# Patient Record
Sex: Male | Born: 1995 | Race: White | Hispanic: No | Marital: Married | Smoking: Never smoker
Health system: Southern US, Community
[De-identification: ages and names within clinical notes are randomized; demographics above are authoritative.]

## PROBLEM LIST (undated history)

## (undated) DIAGNOSIS — L509 Urticaria, unspecified: Secondary | ICD-10-CM

## (undated) DIAGNOSIS — S060XAA Concussion with loss of consciousness status unknown, initial encounter: Secondary | ICD-10-CM

## (undated) DIAGNOSIS — S060X9A Concussion with loss of consciousness of unspecified duration, initial encounter: Secondary | ICD-10-CM

## (undated) DIAGNOSIS — T783XXA Angioneurotic edema, initial encounter: Secondary | ICD-10-CM

## (undated) HISTORY — DX: Angioneurotic edema, initial encounter: T78.3XXA

## (undated) HISTORY — DX: Urticaria, unspecified: L50.9

---
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
## 2020-02-14 ENCOUNTER — Other Ambulatory Visit: Payer: Self-pay

## 2020-02-14 ENCOUNTER — Encounter (HOSPITAL_COMMUNITY): Payer: Self-pay | Admitting: Emergency Medicine

## 2020-02-14 ENCOUNTER — Emergency Department (HOSPITAL_COMMUNITY)
Admission: EM | Admit: 2020-02-14 | Discharge: 2020-02-15 | Disposition: A | Discharge status: Home / Self Care | Payer: Commercial Managed Care - PPO | Attending: Emergency Medicine | Admitting: Emergency Medicine

## 2020-02-14 DIAGNOSIS — Y92410 Unspecified street and highway as the place of occurrence of the external cause: Secondary | ICD-10-CM | POA: Insufficient documentation

## 2020-02-14 DIAGNOSIS — S0990XA Unspecified injury of head, initial encounter: Secondary | ICD-10-CM | POA: Diagnosis present

## 2020-02-14 DIAGNOSIS — S060X0A Concussion without loss of consciousness, initial encounter: Secondary | ICD-10-CM | POA: Diagnosis not present

## 2020-02-14 DIAGNOSIS — M25512 Pain in left shoulder: Secondary | ICD-10-CM | POA: Diagnosis not present

## 2020-02-14 HISTORY — DX: Concussion with loss of consciousness status unknown, initial encounter: S06.0XAA

## 2020-02-14 HISTORY — DX: Concussion with loss of consciousness of unspecified duration, initial encounter: S06.0X9A
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
## 2020-02-15 ENCOUNTER — Emergency Department (HOSPITAL_COMMUNITY): Payer: Commercial Managed Care - PPO

## 2020-02-15 MED ORDER — ACETAMINOPHEN 325 MG PO TABS
650.0000 mg | ORAL_TABLET | Freq: Once | ORAL | Status: AC
  Administered 2020-02-15: 650 mg via ORAL
  Filled 2020-02-15: qty 2
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
## 2022-07-09 ENCOUNTER — Other Ambulatory Visit: Payer: Self-pay

## 2022-07-09 ENCOUNTER — Ambulatory Visit (INDEPENDENT_AMBULATORY_CARE_PROVIDER_SITE_OTHER): Payer: 59 | Admitting: Allergy

## 2022-07-09 ENCOUNTER — Encounter: Payer: Self-pay | Admitting: Allergy

## 2022-07-09 VITALS — BP 100/72 | HR 70 | Temp 98.7°F | Resp 16 | Ht 79.0 in | Wt 225.9 lb

## 2022-07-09 DIAGNOSIS — T7800XD Anaphylactic reaction due to unspecified food, subsequent encounter: Secondary | ICD-10-CM

## 2022-07-09 DIAGNOSIS — H109 Unspecified conjunctivitis: Secondary | ICD-10-CM

## 2022-07-09 DIAGNOSIS — J310 Chronic rhinitis: Secondary | ICD-10-CM | POA: Diagnosis not present

## 2022-07-09 DIAGNOSIS — H1013 Acute atopic conjunctivitis, bilateral: Secondary | ICD-10-CM

## 2022-07-09 NOTE — Patient Instructions (Signed)
-   continue avoidance of tree nuts and dairy in the diet    - have access to self-injectable epinephrine (Epipen) 0.3mg  at all times    - follow emergency action plan in case of allergic reaction    - will plan for skin testing to tree nuts and milk     - continue Zyrtec 10mg  daily as needed     - continue Pataday or similar allergy based eye drop daily as needed for itchy/watery eyes    - recommend nasal saline rinses to help clean/flush out nasal cavity    - will plan for skin testing for environmental allergens  Follow-up for skin testing visit

## 2022-07-09 NOTE — Progress Notes (Signed)
New Patient Note  RE: Richard Stuart MRN: [id number] DOB: [birth year] Date of Office Visit: 07/09/2022  Primary care provider: Ammie Dalton  Chief Complaint: establish care, food allergy  History of present illness: Richard Stuart is a 27 y.o. male presenting today for evaluation of food allergy. He presents today with his wife and daughter.   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   He states he has had reactions "tons of times" and cross-contamination reactions.  He states most recent reaction was several years ago after having a sandwhich with pistachios that he was not aware of.  He states he had difficulty breathing, tongue swelling, throat swelling, hives.  He had to go to ED and recalls getting epinephrine.  He can eat peanut products without issue.   With whey protein ingestion he has had hives and vomiting.  He developed this as an adult.  He does avoid all milk and milk baked products.   He has an up-to-date epipen device.    He does report itchy/watery eyes, nasal congestion/drainage, sneezing that is worse during spring and fall. When symptoms are worse will take zyrtec which helps.  Will use pataday or lastacaft as needed for eye symptoms.  He states last environmental allergy testing was about 10 years ago in a different region.   No history of asthma, eczema.  He states several weeks ago he had chest tightness after exercising for couple hours and states he did a lot of running and intense exercises.  Wife states he may have been deconditioned as usually doesn't workout that hard.  He has exercised since but not at the same intensity level and did not notice any respiratory symptoms.   He is following here with hematology for history of splenomegaly with secondary leukopenia/thrombocytopenia.  Review of systems: Review of Systems  Constitutional: Negative.   HENT:  Positive for congestion, postnasal drip and sneezing.   Eyes:  Positive  for itching.  Respiratory: Negative.    Cardiovascular: Negative.   Musculoskeletal: Negative.   Skin: Negative.   Allergic/Immunologic: Negative.   Neurological: Negative.     All other systems negative unless noted above in HPI  Past medical history: Past Medical History:  Diagnosis Date   Angio-edema    Concussion    Urticaria     Past surgical history: History reviewed. No pertinent surgical history.  Family history:  Family History  Problem Relation Age of Onset   Asthma Mother    Angioedema Mother    Allergic rhinitis Mother    Allergic rhinitis Sister     Social history: Lives in a townhome with carpeting with electric heating and central cooling.  No pets in the home.  Neighbors with dogs.  No concern for water damage, mildew or roaches in the home.  Works as an Systems analyst.  Does not report smoking/vaping history.    Medication List: Current Outpatient Medications  Medication Sig Dispense Refill   acetaminophen-codeine (TYLENOL #3) 300-30 MG tablet Take by mouth.     cetirizine (ZYRTEC) 10 MG tablet 1 tablet     clotrimazole-betamethasone (LOTRISONE) cream Use on affected area  2 times daily until resolution then 1 more week.     cyclobenzaprine (FLEXERIL) 10 MG tablet Take by mouth.     EPINEPHrine 0.3 mg/0.3 mL IJ SOAJ injection See admin instructions.     famotidine (PEPCID) 10 MG tablet 1 tablet as needed     ondansetron (ZOFRAN)  8 MG tablet      No current facility-administered medications for this visit.    Known medication allergies: Allergies  Allergen Reactions   Other Anaphylaxis   Whey Other (See Comments), Hives and Nausea And Vomiting     Physical examination: Blood pressure 100/72, pulse 70, temperature 98.7 F (37.1 C), resp. rate 16, height 6\' 7"  (2.007 m), weight 225 lb 14.4 oz (102.5 kg), SpO2 97 %.  General: Alert, interactive, in no acute distress. HEENT: PERRLA, TMs pearly gray, turbinates non-edematous without  discharge, post-pharynx non erythematous. Neck: Supple without lymphadenopathy. Lungs: Clear to auscultation without wheezing, rhonchi or rales. {no increased work of breathing. CV: Normal S1, S2 without murmurs. Abdomen: Nondistended, nontender. Skin: Warm and dry, without lesions or rashes. Extremities:  No clubbing, cyanosis or edema. Neuro:   Grossly intact.  Diagnositics/Labs: None today  Assessment and plan: Anaphylaxis due to food     - continue avoidance of tree nuts and dairy in the diet    - have access to self-injectable epinephrine (Epipen) 0.3mg  at all times    - follow emergency action plan in case of allergic reaction    - will plan for skin testing to tree nuts and milk    - if testing still positive then will discuss therapy options including OIT and Xolair  Rhinoconjunctivitis    - continue Zyrtec 10mg  daily as needed     - continue Pataday or similar allergy based eye drop daily as needed for itchy/watery eyes    - recommend nasal saline rinses to help clean/flush out nasal cavity    - will plan for skin testing for environmental allergens  Advised to monitor for symptoms with exercise and if becoming a pattern may benefit from albuterol    Follow-up for skin testing visit   I appreciate the opportunity to take part in Richard Stuart's care. Please do not hesitate to contact me with questions.  Sincerely,   Prudy Feeler, MD Allergy/Immunology [facility]

## 2022-07-11 ENCOUNTER — Emergency Department (HOSPITAL_BASED_OUTPATIENT_CLINIC_OR_DEPARTMENT_OTHER)
Admission: EM | Admit: 2022-07-11 | Discharge: 2022-07-11 | Disposition: A | Discharge status: Home / Self Care | Payer: 59 | Attending: Emergency Medicine | Admitting: Emergency Medicine

## 2022-07-11 ENCOUNTER — Emergency Department (HOSPITAL_BASED_OUTPATIENT_CLINIC_OR_DEPARTMENT_OTHER): Payer: 59 | Admitting: Radiology

## 2022-07-11 ENCOUNTER — Encounter (HOSPITAL_BASED_OUTPATIENT_CLINIC_OR_DEPARTMENT_OTHER): Payer: Self-pay | Admitting: Emergency Medicine

## 2022-07-11 ENCOUNTER — Other Ambulatory Visit: Payer: Self-pay

## 2022-07-11 DIAGNOSIS — R0789 Other chest pain: Secondary | ICD-10-CM

## 2022-07-11 LAB — CBC
HCT: 43 % (ref 39.0–52.0)
Hemoglobin: 14.8 g/dL (ref 13.0–17.0)
MCH: 30.1 pg (ref 26.0–34.0)
MCHC: 34.4 g/dL (ref 30.0–36.0)
MCV: 87.6 fL (ref 80.0–100.0)
Platelets: 160 10*3/uL (ref 150–400)
RBC: 4.91 MIL/uL (ref 4.22–5.81)
RDW: 12.2 % (ref 11.5–15.5)
WBC: 5.5 10*3/uL (ref 4.0–10.5)
nRBC: 0 % (ref 0.0–0.2)

## 2022-07-11 LAB — BASIC METABOLIC PANEL
Anion gap: 7 (ref 5–15)
BUN: 18 mg/dL (ref 6–20)
CO2: 28 mmol/L (ref 22–32)
Calcium: 9.6 mg/dL (ref 8.9–10.3)
Chloride: 104 mmol/L (ref 98–111)
Creatinine, Ser: 1.13 mg/dL (ref 0.61–1.24)
GFR, Estimated: >60 mL/min (ref >60)
Glucose, Bld: 101 mg/dL — ABNORMAL HIGH (ref 70–99)
Potassium: 3.8 mmol/L (ref 3.5–5.1)
Sodium: 139 mmol/L (ref 135–145)

## 2022-07-11 LAB — TROPONIN I (HIGH SENSITIVITY): Troponin I (High Sensitivity): <2 ng/L (ref <18)

## 2022-07-11 MED ORDER — KETOROLAC TROMETHAMINE 30 MG/ML IJ SOLN
30.0000 mg | Freq: Once | INTRAMUSCULAR | Status: AC
  Administered 2022-07-11: 30 mg via INTRAVENOUS
  Filled 2022-07-11: qty 1

## 2022-07-11 MED ORDER — NAPROXEN 500 MG PO TABS: 500.0000 mg | ORAL_TABLET | Freq: Two times a day (BID) | ORAL | 0 refills | Status: AC

## 2022-07-11 NOTE — ED Provider Notes (Signed)
[place] [facility]   CSN: [id number] Arrival date & time: 07/11/22  0500     History  Chief Complaint  Patient presents with   Chest Pain    Richard Stuart is a 27 y.o. male.  Patient is a 89-year-old male with no significant past medical history.  Patient presenting today with complaints of chest discomfort.  He describes a squeezing sensation in the center of his chest just left of his sternum that has been ongoing for the past 2 days.  He was seen yesterday at urgent care and advised to come to the ER if his symptoms worsen.  This evening while he was sleeping, the sensation worsened and presents for evaluation of this.  He denies to me he is having any shortness of breath, nausea, diaphoresis.  He does describe the pain is radiating to his left shoulder.  No fevers, chills, or cough.  The history is provided by the patient.       Home Medications Prior to Admission medications   Medication Sig Start Date End Date Taking? Authorizing Provider  acetaminophen-codeine (TYLENOL #3) 300-30 MG tablet Take by mouth. 09/28/14   [provider]  cetirizine (ZYRTEC) 10 MG tablet 1 tablet 03/08/21   [provider]  clotrimazole-betamethasone (LOTRISONE) cream Use on affected area  2 times daily until resolution then 1 more week. 04/01/22   [provider]  cyclobenzaprine (FLEXERIL) 10 MG tablet Take by mouth. 12/21/20   [provider]  EPINEPHrine 0.3 mg/0.3 mL IJ SOAJ injection See admin instructions. 03/16/20   [provider]  famotidine (PEPCID) 10 MG tablet 1 tablet as needed 01/10/21   [provider]  ondansetron (ZOFRAN) 8 MG tablet  01/10/21   [provider]      Allergies    Other and Whey    Review of Systems   Review of Systems  All other systems reviewed and are negative.   Physical Exam Updated Vital Signs BP 126/81 (BP Location: Right Arm)   Pulse (!) 57    Temp 98.2 F (36.8 C) (Oral)   Resp 16   Ht 6\' 7"  (2.007 m)   Wt 102.1 kg   SpO2 100%   BMI 25.35 kg/m  Physical Exam Vitals and nursing note reviewed.  Constitutional:      General: He is not in acute distress.    Appearance: He is well-developed. He is not diaphoretic.  HENT:     Head: Normocephalic and atraumatic.  Cardiovascular:     Rate and Rhythm: Normal rate and regular rhythm.     Heart sounds: No murmur heard.    No friction rub.  Pulmonary:     Effort: Pulmonary effort is normal. No respiratory distress.     Breath sounds: Normal breath sounds. No wheezing or rales.  Abdominal:     General: Bowel sounds are normal. There is no distension.     Palpations: Abdomen is soft.     Tenderness: There is no abdominal tenderness.  Musculoskeletal:        General: Normal range of motion.     Cervical back: Normal range of motion and neck supple.     Right lower leg: No tenderness. No edema.     Left lower leg: No tenderness. No edema.  Skin:    General: Skin is warm and dry.  Neurological:     Mental Status: He is alert and oriented to person, place, and time.  Coordination: Coordination normal.     ED Results / Procedures / Treatments   Labs (all labs ordered are listed, but only abnormal results are displayed) Labs Reviewed  CBC  BASIC METABOLIC PANEL  TROPONIN I (HIGH SENSITIVITY)    EKG EKG Interpretation  Date/Time:  Friday July 11 2022 05:06:41 EDT Ventricular Rate:  57 PR Interval:  124 QRS Duration: 92 QT Interval:  417 QTC Calculation: 406 R Axis:   79 Text Interpretation: Sinus rhythm Early repolarization Otherwise normal ECG Confirmed by Geoffery Lyons ([id number]) on 07/11/2022 5:09:53 AM  Radiology No results found.  Procedures Procedures    Medications Ordered in ED Medications  ketorolac (TORADOL) 30 MG/ML injection 30 mg (has no administration in time range)    ED Course/ Medical Decision Making/ A&P  Patient is a 27 year old  male presenting with complaints of chest discomfort as described in the HPI.  Patient arrives here with stable vital signs, no hypoxia, and is afebrile.  Physical examination is basically unremarkable.  Workup initiated including CBC, metabolic panel, and troponin, all of which are unremarkable.  Chest x-ray shows no acute process.  Patient given IV Toradol for pain and seems to be feeling better.  Patient has atypical symptoms with negative workup despite 2 days of symptoms.  I highly doubt a cardiac etiology or other emergent process.  Symptoms most likely musculoskeletal in nature.  I will treat with naproxen, rest, and follow-up as needed.  Final Clinical Impression(s) / ED Diagnoses Final diagnoses:  None    Rx / DC Orders ED Discharge Orders     None         Geoffery Lyons, MD 07/11/22 [phone]

## 2022-07-11 NOTE — Discharge Instructions (Addendum)
Begin taking naproxen as prescribed.  Rest.  Follow-up with primary doctor if not improving in the next few days, and return to the ER if symptoms significantly worsen or change.

## 2022-07-11 NOTE — ED Triage Notes (Addendum)
Pt c/o intermittent left sided chest pain ongoing since Wednesday. Pain radiating into right shoulder described as a squeezing sharp pain. Pain associated with SOB and lightheadedness. No cardiac history.   Per note pt was seen earlier this week at [facility]. Was recommended ER visit if s/s worsened.

## 2022-11-19 ENCOUNTER — Other Ambulatory Visit (HOSPITAL_COMMUNITY): Payer: Self-pay | Admitting: Family Medicine

## 2022-11-19 ENCOUNTER — Ambulatory Visit (HOSPITAL_COMMUNITY)
Admission: RE | Admit: 2022-11-19 | Discharge: 2022-11-19 | Disposition: A | Discharge status: Home / Self Care | Payer: 59 | Source: Ambulatory Visit | Attending: Vascular Surgery | Admitting: Vascular Surgery

## 2022-11-19 DIAGNOSIS — R52 Pain, unspecified: Secondary | ICD-10-CM | POA: Insufficient documentation

## 2022-12-11 DIAGNOSIS — F4321 Adjustment disorder with depressed mood: Secondary | ICD-10-CM | POA: Diagnosis not present

## 2022-12-15 DIAGNOSIS — K222 Esophageal obstruction: Secondary | ICD-10-CM | POA: Diagnosis not present

## 2022-12-15 DIAGNOSIS — R1310 Dysphagia, unspecified: Secondary | ICD-10-CM | POA: Diagnosis not present

## 2022-12-15 DIAGNOSIS — K209 Esophagitis, unspecified without bleeding: Secondary | ICD-10-CM | POA: Diagnosis not present

## 2022-12-18 DIAGNOSIS — F4321 Adjustment disorder with depressed mood: Secondary | ICD-10-CM | POA: Diagnosis not present

## 2022-12-22 DIAGNOSIS — D471 Chronic myeloproliferative disease: Secondary | ICD-10-CM | POA: Diagnosis not present

## 2022-12-22 DIAGNOSIS — R161 Splenomegaly, not elsewhere classified: Secondary | ICD-10-CM | POA: Diagnosis not present

## 2022-12-25 DIAGNOSIS — F4321 Adjustment disorder with depressed mood: Secondary | ICD-10-CM | POA: Diagnosis not present

## 2023-01-16 DIAGNOSIS — F4321 Adjustment disorder with depressed mood: Secondary | ICD-10-CM | POA: Diagnosis not present

## 2023-01-22 DIAGNOSIS — F4321 Adjustment disorder with depressed mood: Secondary | ICD-10-CM | POA: Diagnosis not present

## 2023-01-30 DIAGNOSIS — F4321 Adjustment disorder with depressed mood: Secondary | ICD-10-CM | POA: Diagnosis not present

## 2023-02-12 DIAGNOSIS — F4321 Adjustment disorder with depressed mood: Secondary | ICD-10-CM | POA: Diagnosis not present

## 2023-02-22 DIAGNOSIS — R059 Cough, unspecified: Secondary | ICD-10-CM | POA: Diagnosis not present

## 2023-02-22 DIAGNOSIS — R52 Pain, unspecified: Secondary | ICD-10-CM | POA: Diagnosis not present

## 2023-02-22 DIAGNOSIS — B349 Viral infection, unspecified: Secondary | ICD-10-CM | POA: Diagnosis not present

## 2023-02-22 DIAGNOSIS — J0110 Acute frontal sinusitis, unspecified: Secondary | ICD-10-CM | POA: Diagnosis not present

## 2023-02-22 DIAGNOSIS — R509 Fever, unspecified: Secondary | ICD-10-CM | POA: Diagnosis not present

## 2023-02-26 DIAGNOSIS — F4321 Adjustment disorder with depressed mood: Secondary | ICD-10-CM | POA: Diagnosis not present

## 2023-03-12 DIAGNOSIS — F4321 Adjustment disorder with depressed mood: Secondary | ICD-10-CM | POA: Diagnosis not present

## 2023-04-02 DIAGNOSIS — F4321 Adjustment disorder with depressed mood: Secondary | ICD-10-CM | POA: Diagnosis not present

## 2023-04-23 DIAGNOSIS — F4321 Adjustment disorder with depressed mood: Secondary | ICD-10-CM | POA: Diagnosis not present

## 2023-05-07 DIAGNOSIS — F4321 Adjustment disorder with depressed mood: Secondary | ICD-10-CM | POA: Diagnosis not present

## 2023-05-14 DIAGNOSIS — F4321 Adjustment disorder with depressed mood: Secondary | ICD-10-CM | POA: Diagnosis not present

## 2023-05-21 DIAGNOSIS — F4321 Adjustment disorder with depressed mood: Secondary | ICD-10-CM | POA: Diagnosis not present

## 2023-06-04 DIAGNOSIS — F4321 Adjustment disorder with depressed mood: Secondary | ICD-10-CM | POA: Diagnosis not present

## 2023-06-11 DIAGNOSIS — F4321 Adjustment disorder with depressed mood: Secondary | ICD-10-CM | POA: Diagnosis not present

## 2023-06-18 DIAGNOSIS — F4321 Adjustment disorder with depressed mood: Secondary | ICD-10-CM | POA: Diagnosis not present

## 2023-06-25 DIAGNOSIS — F4321 Adjustment disorder with depressed mood: Secondary | ICD-10-CM | POA: Diagnosis not present

## 2023-07-01 DIAGNOSIS — F4321 Adjustment disorder with depressed mood: Secondary | ICD-10-CM | POA: Diagnosis not present

## 2023-07-08 DIAGNOSIS — F4321 Adjustment disorder with depressed mood: Secondary | ICD-10-CM | POA: Diagnosis not present

## 2023-07-15 DIAGNOSIS — F4321 Adjustment disorder with depressed mood: Secondary | ICD-10-CM | POA: Diagnosis not present

## 2023-07-21 DIAGNOSIS — Z0000 Encounter for general adult medical examination without abnormal findings: Secondary | ICD-10-CM | POA: Diagnosis not present

## 2023-07-21 DIAGNOSIS — Z13220 Encounter for screening for lipoid disorders: Secondary | ICD-10-CM | POA: Diagnosis not present

## 2023-07-22 DIAGNOSIS — F4321 Adjustment disorder with depressed mood: Secondary | ICD-10-CM | POA: Diagnosis not present

## 2023-07-29 DIAGNOSIS — F4321 Adjustment disorder with depressed mood: Secondary | ICD-10-CM | POA: Diagnosis not present

## 2023-08-10 DIAGNOSIS — Z0184 Encounter for antibody response examination: Secondary | ICD-10-CM | POA: Diagnosis not present

## 2023-08-10 DIAGNOSIS — Z23 Encounter for immunization: Secondary | ICD-10-CM | POA: Diagnosis not present

## 2023-08-12 DIAGNOSIS — F4321 Adjustment disorder with depressed mood: Secondary | ICD-10-CM | POA: Diagnosis not present

## 2023-08-18 DIAGNOSIS — R161 Splenomegaly, not elsewhere classified: Secondary | ICD-10-CM | POA: Diagnosis not present

## 2023-08-18 DIAGNOSIS — D471 Chronic myeloproliferative disease: Secondary | ICD-10-CM | POA: Diagnosis not present

## 2023-08-19 DIAGNOSIS — F4321 Adjustment disorder with depressed mood: Secondary | ICD-10-CM | POA: Diagnosis not present

## 2023-08-26 DIAGNOSIS — F422 Mixed obsessional thoughts and acts: Secondary | ICD-10-CM | POA: Diagnosis not present

## 2023-09-02 DIAGNOSIS — F4321 Adjustment disorder with depressed mood: Secondary | ICD-10-CM | POA: Diagnosis not present

## 2023-09-09 DIAGNOSIS — F4321 Adjustment disorder with depressed mood: Secondary | ICD-10-CM | POA: Diagnosis not present

## 2023-09-10 DIAGNOSIS — Z23 Encounter for immunization: Secondary | ICD-10-CM | POA: Diagnosis not present

## 2023-09-16 DIAGNOSIS — F4321 Adjustment disorder with depressed mood: Secondary | ICD-10-CM | POA: Diagnosis not present

## 2023-09-30 DIAGNOSIS — F4321 Adjustment disorder with depressed mood: Secondary | ICD-10-CM | POA: Diagnosis not present

## 2023-10-07 DIAGNOSIS — F4321 Adjustment disorder with depressed mood: Secondary | ICD-10-CM | POA: Diagnosis not present
